# Patient Record
Sex: Male | Born: 1985 | Race: White | Hispanic: No | Marital: Married | Smoking: Never smoker
Health system: Southern US, Community
[De-identification: ages and names within clinical notes are randomized; demographics above are authoritative.]

## PROBLEM LIST (undated history)

## (undated) HISTORY — PX: NO PAST SURGERIES: SHX2092

---

## 2010-11-29 ENCOUNTER — Ambulatory Visit: Payer: Self-pay | Admitting: Family Medicine

## 2011-03-16 ENCOUNTER — Ambulatory Visit: Payer: Self-pay | Admitting: Internal Medicine

## 2011-03-18 ENCOUNTER — Ambulatory Visit: Payer: Self-pay | Admitting: Family Medicine

## 2011-03-21 ENCOUNTER — Ambulatory Visit: Payer: Self-pay | Admitting: Internal Medicine

## 2011-03-26 ENCOUNTER — Ambulatory Visit: Payer: Self-pay | Admitting: Family Medicine

## 2013-01-21 ENCOUNTER — Emergency Department: Payer: Self-pay | Admitting: Emergency Medicine

## 2014-06-29 IMAGING — CR DG ANKLE COMPLETE 3+V*L*
1 series · 5 of 5 positions shown · non-contrast
Comparison: none

REASON FOR EXAM: pain
COMMENTS:

PROCEDURE:     DXR - DXR ANKLE LEFT COMPLETE  - January 21, 2013  [DATE]
RESULT:     Comparison: None

[Series 1: ap · 0.17mm/px · 5 of 5 slices shown]
[im 1/5]
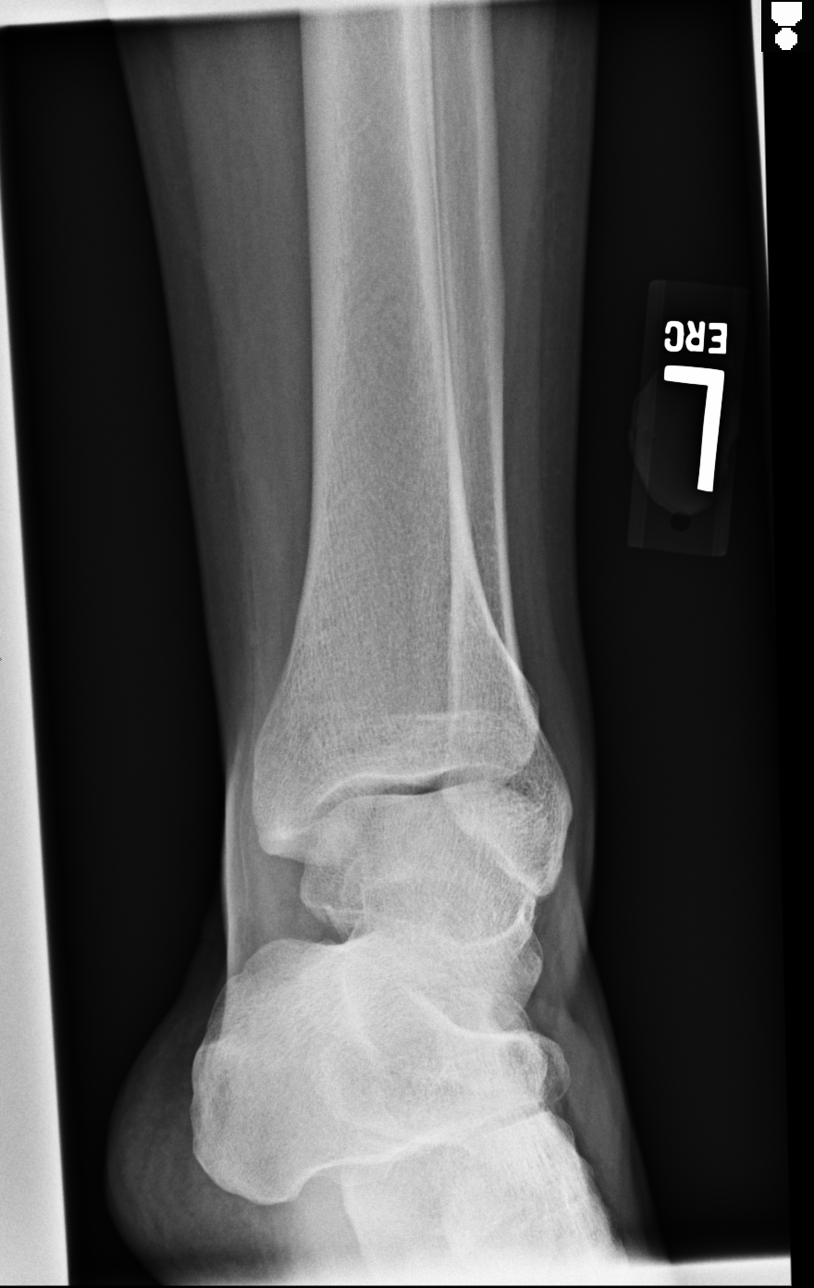
[im 2/5]
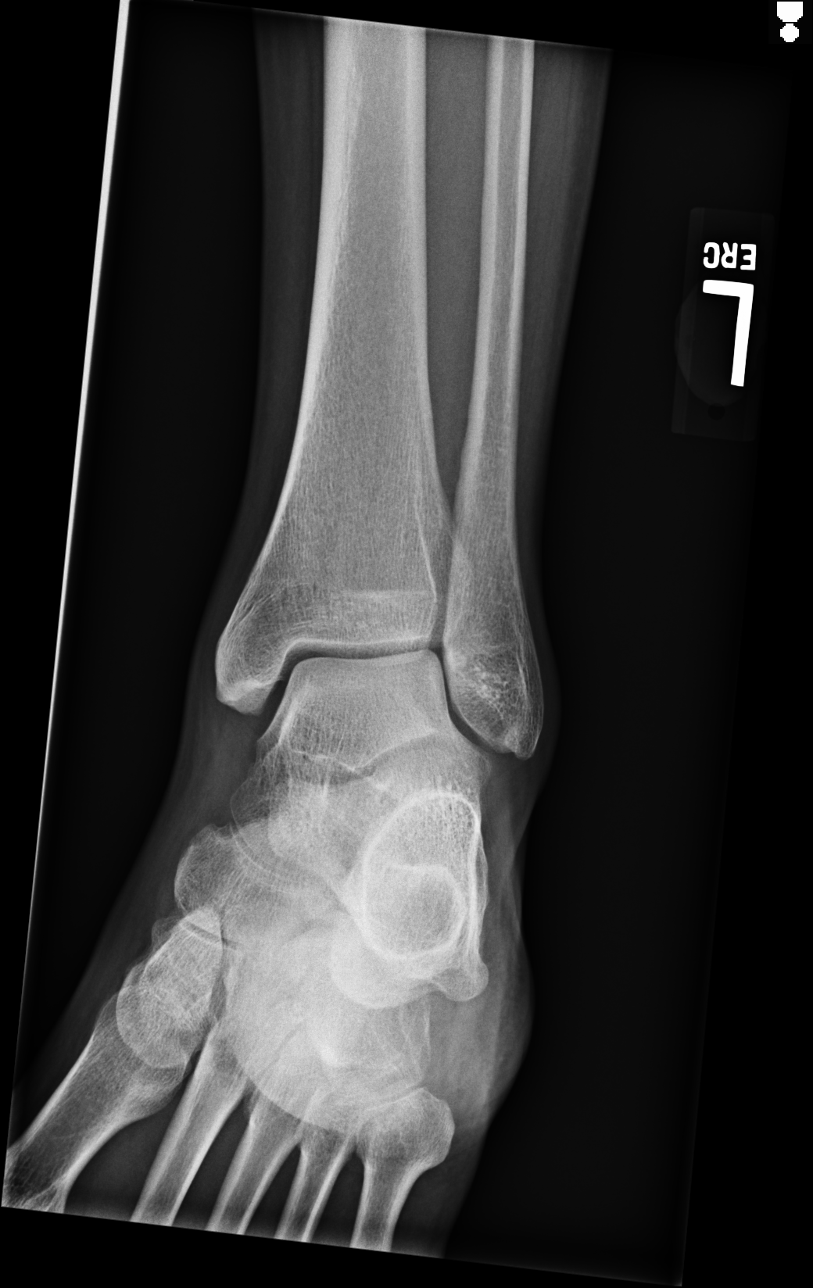
[im 3/5]
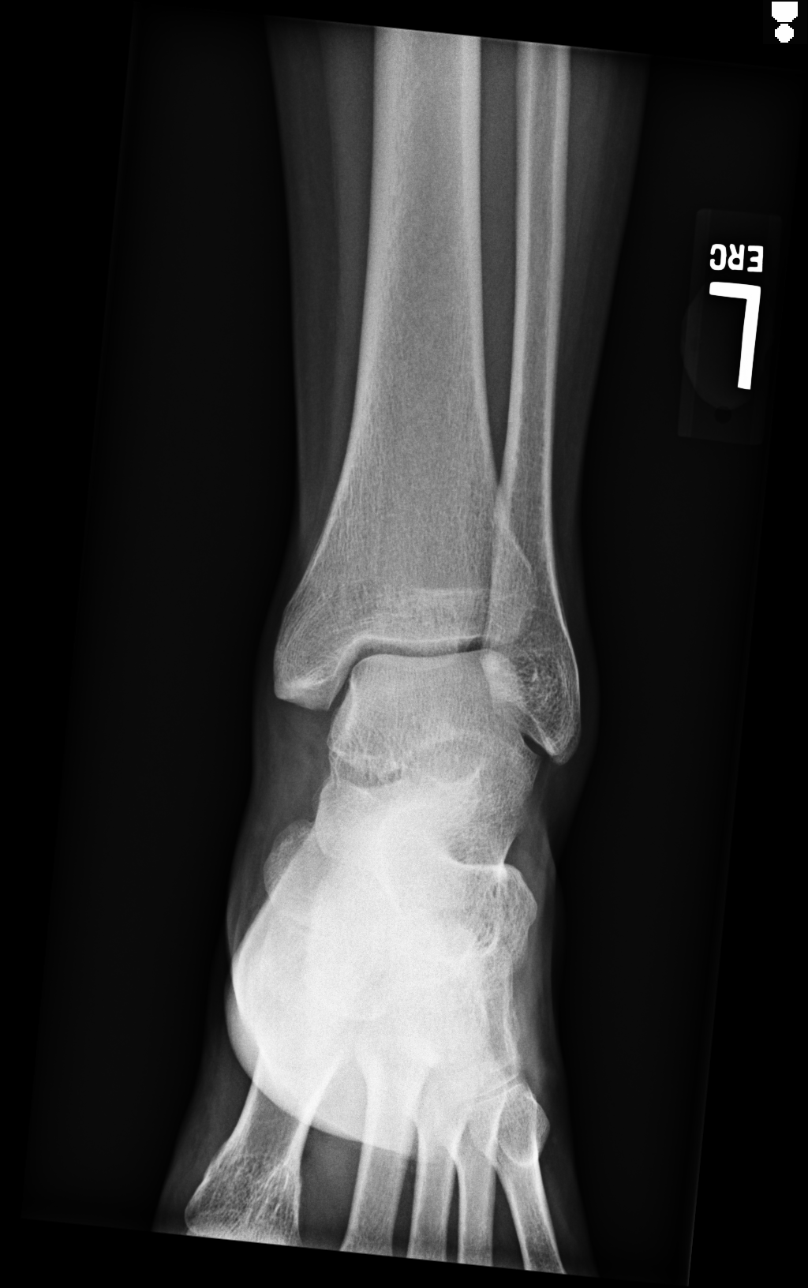
[im 4/5]
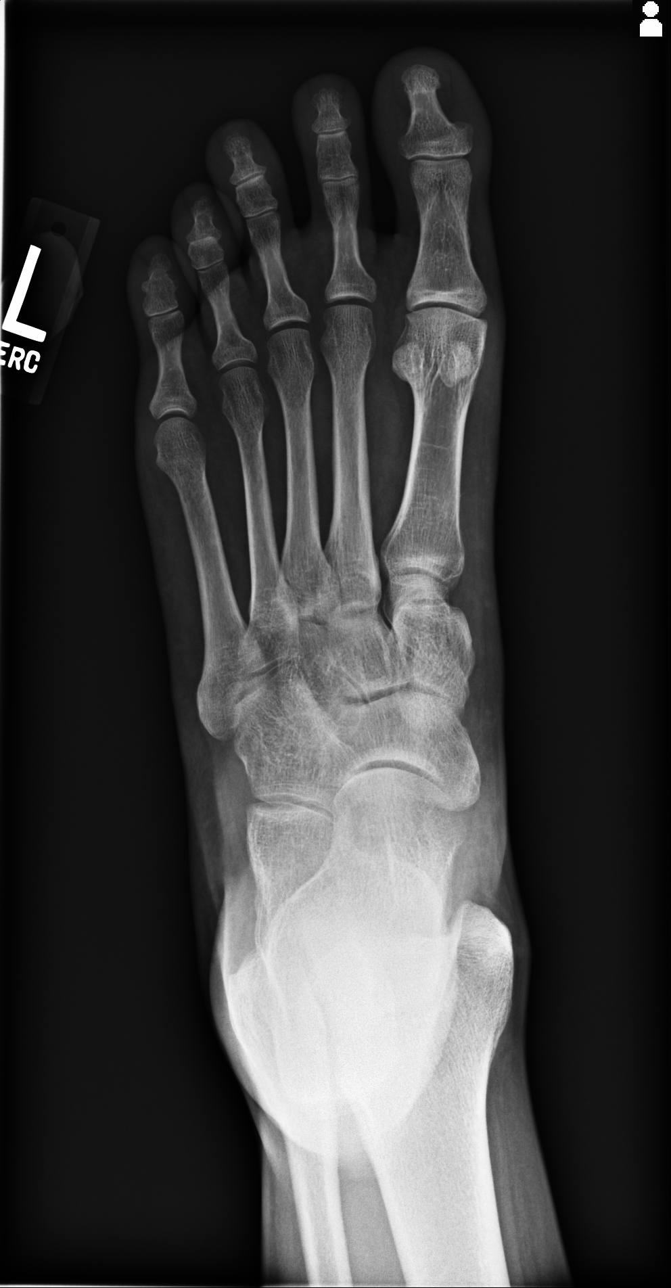
[im 5/5]
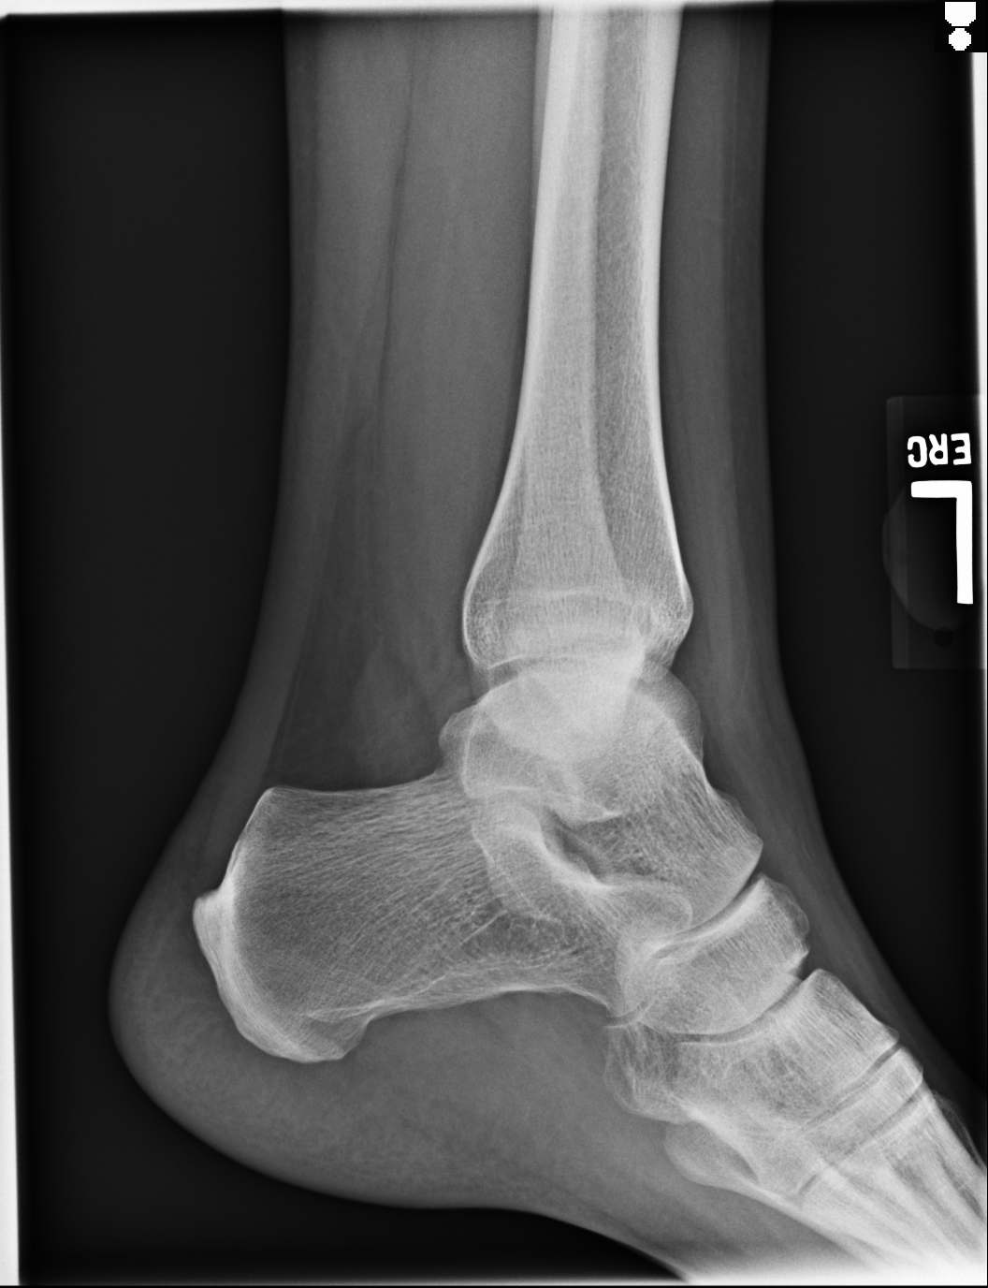

[5 of 5 positions shown; findings below may reference images not displayed]

FINDINGS: 5 views of the left ankle demonstrate no fracture or dislocation. There
ankle mortise is intact. There is no significant joint effusion. The soft
tissues are normal.
IMPRESSION: No acute osseous injury of the left ankle.

[REDACTED]

## 2018-06-12 ENCOUNTER — Ambulatory Visit
Admission: EM | Admit: 2018-06-12 | Discharge: 2018-06-12 | Disposition: A | Discharge status: Home / Self Care | Payer: BC Managed Care – PPO | Attending: Family Medicine | Admitting: Family Medicine

## 2018-06-12 ENCOUNTER — Other Ambulatory Visit: Payer: Self-pay

## 2018-06-12 DIAGNOSIS — J069 Acute upper respiratory infection, unspecified: Secondary | ICD-10-CM

## 2018-06-12 DIAGNOSIS — J029 Acute pharyngitis, unspecified: Secondary | ICD-10-CM

## 2018-06-12 LAB — RAPID STREP SCREEN (MED CTR MEBANE ONLY): Streptococcus, Group A Screen (Direct): NEGATIVE

## 2018-06-12 MED ORDER — LIDOCAINE VISCOUS HCL 2 % MT SOLN: 10.0000 mL | Freq: Four times a day (QID) | OROMUCOSAL | 0 refills | Status: AC | PRN

## 2018-06-12 NOTE — ED Provider Notes (Signed)
[facility] ____________________________________________  Time seen: Approximately 3:44 PM  I have reviewed the triage vital signs and the nursing notes.   HISTORY  Chief Complaint Sore Throat   HPI Samuel Pratt is a 32 y.o. male presenting for evaluation of 4 to 5 days of nasal congestion, postnasal drainage, some intermittent sinus pressure and sore throat.  States sore throat currently mild.  No current sinus pain pressure.  Has been using intermittent over-the-counter cough and congestion medication as well as his Nettie pot, in which he reports this does help.  Denies home sick contacts.  No known fevers.  Has overall continued to eat and drink well.  Denies other aggravating alleviating factors.  Worse otherwise doing well.  Denies chest pain or shortness of breath.  Tiana Loft, MD: PCP  History reviewed. No pertinent past medical history. Denies   There are no active problems to display for this patient.   Past Surgical History:  Procedure Laterality Date  . NO PAST SURGERIES       No current facility-administered medications for this encounter.   Current Outpatient Medications:  .  cetirizine (ZYRTEC) 5 MG tablet, Take by mouth., Disp: , Rfl:  .  sertraline (ZOLOFT) 25 MG tablet, Take 25 mg by mouth daily., Disp: , Rfl: 1 .  lidocaine (XYLOCAINE) 2 % solution, Use as directed 10 mLs in the mouth or throat every 6 (six) hours as needed (sore throat. gargle and spit as needed for sore throat.)., Disp: 100 mL, Rfl: 0  Allergies Patient has no known allergies.  History reviewed. No pertinent family history.  Social History Social History   Tobacco Use  . Smoking status: Never Smoker  . Smokeless tobacco: Never Used  Substance Use Topics  . Alcohol use: Not Currently  . Drug use: Not Currently    Review of Systems Constitutional: No fever. ENT: as above. Cardiovascular: Denies chest pain. Respiratory: Denies shortness of  breath. Gastrointestinal: No abdominal pain. Musculoskeletal: Negative for back pain. Skin: Negative for rash.   ____________________________________________   PHYSICAL EXAM:  VITAL SIGNS: ED Triage Vitals  Enc Vitals Group     BP 06/12/18 1356 140/89     Pulse Rate 06/12/18 1356 77     Resp 06/12/18 1356 18     Temp 06/12/18 1356 98.6 F (37 C)     Temp Source 06/12/18 1356 Oral     SpO2 06/12/18 1356 98 %     Weight 06/12/18 1354 250 lb (113.4 kg)     Height 06/12/18 1354 6\' 4"  (1.93 m)     Head Circumference --      Peak Flow --      Pain Score 06/12/18 1354 7     Pain Loc --      Pain Edu? --      Excl. in GC? --     Constitutional: Alert and oriented. Well appearing and in no acute distress. Eyes: Conjunctivae are normal. Head: Atraumatic. No sinus tenderness to palpation. No swelling. No erythema.  Ears: no erythema, normal TMs bilaterally.   Nose:Nasal congestion   Mouth/Throat: Mucous membranes are moist. Mild pharyngeal erythema. No tonsillar swelling or exudate.  Neck: No stridor.  No cervical spine tenderness to palpation. Hematological/Lymphatic/Immunilogical: No cervical lymphadenopathy. Cardiovascular: Normal rate, regular rhythm. Grossly normal heart sounds.  Good peripheral circulation. Respiratory: Normal respiratory effort.  No retractions. No wheezes, rales or rhonchi. Good air movement.  Musculoskeletal: Ambulatory with steady gait.  Neurologic:  Normal speech and  language. No gait instability. Skin:  Skin appears warm, dry and intact. No rash noted. Psychiatric: Mood and affect are normal. Speech and behavior are normal.  ___________________________________________   LABS (all labs ordered are listed, but only abnormal results are displayed)  Labs Reviewed  RAPID STREP SCREEN ([facility] ONLY)  CULTURE, GROUP A STREP [facility])    PROCEDURES Procedures  INITIAL IMPRESSION / ASSESSMENT AND PLAN / ED COURSE  Pertinent labs & imaging  results that were available during my care of the patient were reviewed by me and considered in my medical decision making (see chart for details).  Well-appearing patient.  No acute distress.  Suspect viral upper respiratory infection.  Quick strep negative, will culture.  Continue over-the-counter supportive care, PRN viscous lidocaine solution given, and encouraged rest and fluids.  Work note given for today and tomorrow.Discussed indication, risks and benefits of medications with patient.  Discussed follow up with Primary care physician this week as needed. Discussed follow up and return parameters including no resolution or any worsening concerns. Patient verbalized understanding and agreed to plan.   ____________________________________________   FINAL CLINICAL IMPRESSION(S) / ED DIAGNOSES  Final diagnoses:  Pharyngitis, unspecified etiology  Upper respiratory tract infection, unspecified type     ED Discharge Orders         Ordered    lidocaine (XYLOCAINE) 2 % solution  Every 6 hours PRN     06/12/18 1424           Note: This dictation was prepared with Dragon dictation along with smaller phrase technology. Any transcriptional errors that result from this process are unintentional.         Renford DillsMiller, Modelle Vollmer, NP 06/12/18 1547

## 2018-06-12 NOTE — ED Triage Notes (Signed)
Patient complains of sore throat, headache, nasal congestion, body aches and fatigue x Wednesday.

## 2018-06-12 NOTE — Discharge Instructions (Signed)
Take medication as prescribed. Rest. Drink plenty of fluids.  ° °Follow up with your primary care physician this week as needed. Return to Urgent care for new or worsening concerns.  ° °

## 2018-06-15 LAB — CULTURE, GROUP A STREP (THRC)

## 2020-02-09 ENCOUNTER — Ambulatory Visit: Payer: Self-pay
